# Patient Record
Sex: Female | Born: 1995 | Race: Black or African American | Hispanic: No | Marital: Single | Smoking: Never smoker
Health system: Southern US, Community
[De-identification: ages and names within clinical notes are randomized; demographics above are authoritative.]

---

## 2018-03-13 ENCOUNTER — Emergency Department (HOSPITAL_COMMUNITY)
Admission: EM | Admit: 2018-03-13 | Discharge: 2018-03-13 | Disposition: A | Discharge status: Home / Self Care | Payer: No Typology Code available for payment source | Attending: Emergency Medicine | Admitting: Emergency Medicine

## 2018-03-13 ENCOUNTER — Other Ambulatory Visit: Payer: Self-pay

## 2018-03-13 ENCOUNTER — Encounter (HOSPITAL_COMMUNITY): Payer: Self-pay | Admitting: Emergency Medicine

## 2018-03-13 ENCOUNTER — Emergency Department (HOSPITAL_COMMUNITY): Payer: No Typology Code available for payment source

## 2018-03-13 DIAGNOSIS — M545 Low back pain, unspecified: Secondary | ICD-10-CM

## 2018-03-13 DIAGNOSIS — Y939 Activity, unspecified: Secondary | ICD-10-CM | POA: Diagnosis not present

## 2018-03-13 DIAGNOSIS — Y92410 Unspecified street and highway as the place of occurrence of the external cause: Secondary | ICD-10-CM | POA: Insufficient documentation

## 2018-03-13 DIAGNOSIS — Y999 Unspecified external cause status: Secondary | ICD-10-CM | POA: Insufficient documentation

## 2018-03-13 DIAGNOSIS — S4992XA Unspecified injury of left shoulder and upper arm, initial encounter: Secondary | ICD-10-CM | POA: Diagnosis present

## 2018-03-13 DIAGNOSIS — S46819A Strain of other muscles, fascia and tendons at shoulder and upper arm level, unspecified arm, initial encounter: Secondary | ICD-10-CM | POA: Diagnosis not present

## 2018-03-13 LAB — POC URINE PREG, ED: PREG TEST UR: NEGATIVE

## 2018-03-13 MED ORDER — NAPROXEN 500 MG PO TABS: 500.0000 mg | ORAL_TABLET | Freq: Two times a day (BID) | ORAL | 0 refills | Status: DC

## 2018-03-13 MED ORDER — METHOCARBAMOL 500 MG PO TABS: 500.0000 mg | ORAL_TABLET | Freq: Two times a day (BID) | ORAL | 0 refills | Status: DC

## 2018-03-13 MED ORDER — KETOROLAC TROMETHAMINE 15 MG/ML IJ SOLN
15.0000 mg | Freq: Once | INTRAMUSCULAR | Status: AC
  Administered 2018-03-13: 15 mg via INTRAMUSCULAR
  Filled 2018-03-13: qty 1
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
## 2018-03-25 ENCOUNTER — Encounter (HOSPITAL_COMMUNITY): Payer: Self-pay | Admitting: Emergency Medicine

## 2018-03-25 ENCOUNTER — Ambulatory Visit (HOSPITAL_COMMUNITY)
Admission: EM | Admit: 2018-03-25 | Discharge: 2018-03-25 | Disposition: A | Discharge status: Home / Self Care | Payer: Self-pay | Attending: Family Medicine | Admitting: Family Medicine

## 2018-03-25 DIAGNOSIS — R030 Elevated blood-pressure reading, without diagnosis of hypertension: Secondary | ICD-10-CM

## 2018-03-25 DIAGNOSIS — R009 Unspecified abnormalities of heart beat: Secondary | ICD-10-CM

## 2018-03-25 DIAGNOSIS — F430 Acute stress reaction: Secondary | ICD-10-CM

## 2018-03-25 NOTE — Discharge Instructions (Addendum)
I believe the elevated heart rate is a normal reaction to stress.  When you rest, it goes back down to normal.  Heart exam is normal.  I do not believe you have any underlying heart condition.  I do not believe you have any underlying medical condition.  You are due for regular checkup  You are safe to be a plasma donor

## 2018-03-25 NOTE — ED Triage Notes (Signed)
Pt here for medical clearance for plasma donation; pt unsure if she has anxiety; pt denies complaint at present
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
## 2019-02-14 ENCOUNTER — Other Ambulatory Visit: Payer: Self-pay

## 2019-02-14 ENCOUNTER — Encounter (HOSPITAL_COMMUNITY): Payer: Self-pay | Admitting: Emergency Medicine

## 2019-02-14 ENCOUNTER — Emergency Department (HOSPITAL_COMMUNITY)
Admission: EM | Admit: 2019-02-14 | Discharge: 2019-02-14 | Disposition: A | Discharge status: Home / Self Care | Payer: Medicaid Other | Attending: Emergency Medicine | Admitting: Emergency Medicine

## 2019-02-14 DIAGNOSIS — R2242 Localized swelling, mass and lump, left lower limb: Secondary | ICD-10-CM | POA: Insufficient documentation

## 2019-02-14 DIAGNOSIS — M7989 Other specified soft tissue disorders: Secondary | ICD-10-CM

## 2019-02-14 NOTE — ED Triage Notes (Signed)
Pt c/o left foot swelling for apporx 3 weeks after starting a new job where has to stand on her feet for 8 hours and not wearing proper shoes. Pt also slipped at work and fell 2 weeks ago.  Also reports two dark spots on foot, unsure how long they have been there. Pt reports pain with weight bearing.

## 2019-02-14 NOTE — Discharge Instructions (Signed)
Elevate your leg as much as possible.  You can wear a compression stocking or an Ace wrap to help with compression and swelling.  You can take ibuprofen every 6 hours. Return to the emergency department if symptoms persist, worsen, or if you develop redness to your leg, shortness of breath or chest pain.

## 2019-02-14 NOTE — ED Provider Notes (Signed)
[facility] DEPT Provider Note   CSN: [id number] Arrival date & time: 02/14/19  1620     History   Chief Complaint Chief Complaint  Patient presents with  . Foot Swelling    left    HPI Sheryl Berger is a 23 y.o. female without significant past medical history, presenting to the emergency department with 3 weeks of left foot swelling.  She denies any particular injury.  She states she did start a new job where she is on her feet for 8 hours a day and thinks she may have worn too tight of shoes.  She reports pain in her foot with weightbearing.  Denies redness or obvious swelling to her calf.  She states her mother has history of blood clots though no personal history.  She is not currently on any exogenous estrogens.  She has not tried any interventions for her symptoms.  No shortness of breath or chest pain.     The history is provided by the patient.    History reviewed. No pertinent past medical history.  There are no active problems to display for this patient.   History reviewed. No pertinent surgical history.   OB History   No obstetric history on file.      Home Medications    Prior to Admission medications   Medication Sig Start Date End Date Taking? Authorizing Provider  methocarbamol (ROBAXIN) 500 MG tablet Take 1 tablet (500 mg total) by mouth 2 (two) times daily. 03/13/18   Henderly, Britni A, PA-C  naproxen (NAPROSYN) 500 MG tablet Take 1 tablet (500 mg total) by mouth 2 (two) times daily. 03/13/18   Henderly, Britni A, PA-C    Family History No family history on file.  Social History Social History   Tobacco Use  . Smoking status: Never Smoker  . Smokeless tobacco: Never Used  Substance Use Topics  . Alcohol use: Yes    Comment: occassional   . Drug use: Not on file     Allergies   Patient has no known allergies.   Review of Systems Review of Systems  All other systems reviewed and are negative.     Physical Exam Updated Vital Signs BP (!) 138/55 (BP Location: Left Arm)   Pulse 89   Temp 98.6 F (37 C) (Oral)   Resp 18   SpO2 99%   Physical Exam Vitals signs and nursing note reviewed.  Constitutional:      Appearance: She is well-developed.     Comments: Morbidly obese  HENT:     Head: Normocephalic and atraumatic.  Eyes:     Conjunctiva/sclera: Conjunctivae normal.  Cardiovascular:     Rate and Rhythm: Normal rate and regular rhythm.  Pulmonary:     Effort: Pulmonary effort is normal.  Abdominal:     Palpations: Abdomen is soft.  Musculoskeletal:     Comments: Left ankle and foot with swelling.  There is no tenderness, redness or warmth.  Intact distal pulses.  There is no calf tenderness, negative Homans' sign.  Normal range of motion.  Skin:    General: Skin is warm.  Neurological:     Mental Status: She is alert.  Psychiatric:        Behavior: Behavior normal.      ED Treatments / Results  Labs (all labs ordered are listed, but only abnormal results are displayed) Labs Reviewed - No data to display  EKG None  Radiology No results found.  Procedures Procedures (  including critical care time)  Medications Ordered in ED Medications - No data to display   Initial Impression / Assessment and Plan / ED Course  I have reviewed the triage vital signs and the nursing notes.  Pertinent labs & imaging results that were available during my care of the patient were reviewed by me and considered in my medical decision making (see chart for details).       Patient with 3 weeks of atraumatic left foot swelling.  No redness or warmth to suggest cellulitis.  No tenderness.  Patient reports mother with history of blood clots though no personal history and no other risk factors other than obesity.  Recommend further imaging including x-ray and venous ultrasound, however patient states she would prefer to hold off on any imaging at this time.  She states she would  like to attempt conservative management.  Discussed elevation, compression, ice, NSAIDs.  Discussed reasons to return to the emergency department including worsening symptoms or lack of improvement.  Patient is aware we are unable to rule out DVT without proper imaging.  She is safe for discharge at this time.  Discussed results, findings, treatment and follow up. Patient advised of return precautions. Patient verbalized understanding and agreed with plan.   Final Clinical Impressions(s) / ED Diagnoses   Final diagnoses:  Swelling of left foot    ED Discharge Orders    None       Daana Petrasek, [place] N, PA-C 02/14/19 1933    Terald Sleeper, MD 02/15/19 1150

## 2019-02-27 ENCOUNTER — Ambulatory Visit: Payer: Medicaid Other

## 2019-09-25 ENCOUNTER — Telehealth (INDEPENDENT_AMBULATORY_CARE_PROVIDER_SITE_OTHER): Payer: Medicaid Other | Admitting: Primary Care

## 2019-09-25 ENCOUNTER — Other Ambulatory Visit: Payer: Self-pay

## 2019-09-25 ENCOUNTER — Encounter (INDEPENDENT_AMBULATORY_CARE_PROVIDER_SITE_OTHER): Payer: Self-pay | Admitting: Primary Care

## 2019-09-25 DIAGNOSIS — R630 Anorexia: Secondary | ICD-10-CM

## 2019-09-25 DIAGNOSIS — Z7689 Persons encountering health services in other specified circumstances: Secondary | ICD-10-CM

## 2019-09-25 NOTE — Progress Notes (Addendum)
Virtual Visit via Telephone Note  I connected with Sheryl Berger on 09/25/19 at 10:30 AM EDT by telephone and verified that I am speaking with the correct person using two identifiers.   I discussed the limitations, risks, security and privacy concerns of performing an evaluation and management service by telephone and the availability of in person appointments. I also discussed with the patient that there may be a patient responsible charge related to this service. The patient expressed understanding and agreed to proceed. Patient location home Gwinda Passe, NP at [facility]  History of Present Illness: [name] is having a tele visit to establish care and follow up for physical and labs. She voices concerns with nausea at this time.  No past medical history on file.  No current outpatient medications on file prior to visit.   No current facility-administered medications on file prior to visit.   Observations/Objective: Review of Systems  Gastrointestinal: Positive for nausea.  All other systems reviewed and are negative.   Assessment and Plan: Sheryl Berger was seen today for new patient (initial visit).  Diagnoses and all orders for this visit:  Encounter to establish care Gwinda Passe, NP-C will be your  (PCP) she is mastered prepared . She is skilled to diagnosed and treat illness. Also able to answer health concern as well as continuing care of varied medical conditions, not limited by cause, organ system, or diagnosis.    Decrease in appetite When she does not eat she becomes nauseated . Encourage to eat on regular bases or have snack.   Follow Up Instructions:    I discussed the assessment and treatment plan with the patient. The patient was provided an opportunity to ask questions and all were answered. The patient agreed with the plan and demonstrated an understanding of the instructions.   The patient was advised to call back or seek an  in-person evaluation if the symptoms worsen or if the condition fails to improve as anticipated.  I provided 15 minutes of non-face-to-face time during this encounter. Includes previous visit , labs and imaging  Grayce Sessions, NP

## 2019-10-03 ENCOUNTER — Ambulatory Visit (INDEPENDENT_AMBULATORY_CARE_PROVIDER_SITE_OTHER): Payer: Medicaid Other | Admitting: Primary Care

## 2019-10-03 ENCOUNTER — Other Ambulatory Visit: Payer: Self-pay

## 2019-10-03 ENCOUNTER — Other Ambulatory Visit (HOSPITAL_COMMUNITY)
Admission: RE | Admit: 2019-10-03 | Discharge: 2019-10-03 | Disposition: A | Discharge status: Home / Self Care | Payer: Medicaid Other | Source: Ambulatory Visit | Attending: Primary Care | Admitting: Primary Care

## 2019-10-03 ENCOUNTER — Encounter (INDEPENDENT_AMBULATORY_CARE_PROVIDER_SITE_OTHER): Payer: Self-pay | Admitting: Primary Care

## 2019-10-03 VITALS — BP 111/78 | HR 66 | Temp 97.3°F | Ht 60.0 in | Wt 281.6 lb

## 2019-10-03 DIAGNOSIS — Z114 Encounter for screening for human immunodeficiency virus [HIV]: Secondary | ICD-10-CM

## 2019-10-03 DIAGNOSIS — Z124 Encounter for screening for malignant neoplasm of cervix: Secondary | ICD-10-CM

## 2019-10-03 DIAGNOSIS — Z01419 Encounter for gynecological examination (general) (routine) without abnormal findings: Secondary | ICD-10-CM | POA: Diagnosis present

## 2019-10-03 DIAGNOSIS — Z1159 Encounter for screening for other viral diseases: Secondary | ICD-10-CM

## 2019-10-03 DIAGNOSIS — Z23 Encounter for immunization: Secondary | ICD-10-CM

## 2019-10-03 NOTE — Progress Notes (Signed)
Subjective:     Sheryl Berger is a 24 y.o. female and is here for a comprehensive physical exam. The patient reports no problems.  Social History   Socioeconomic History  . Marital status: Single    Spouse name: Not on file  . Number of children: Not on file  . Years of education: Not on file  . Highest education level: Not on file  Occupational History  . Not on file  Tobacco Use  . Smoking status: Never Smoker  . Smokeless tobacco: Never Used  Vaping Use  . Vaping Use: Never used  Substance and Sexual Activity  . Alcohol use: Yes    Comment: occassional   . Drug use: Not on file  . Sexual activity: Not on file  Other Topics Concern  . Not on file  Social History Narrative  . Not on file   Social Determinants of Health   Financial Resource Strain:   . Difficulty of Paying Living Expenses:   Food Insecurity:   . Worried About Charity fundraiser in the Last Year:   . Arboriculturist in the Last Year:   Transportation Needs:   . Film/video editor (Medical):   Marland Kitchen Lack of Transportation (Non-Medical):   Physical Activity:   . Days of Exercise per Week:   . Minutes of Exercise per Session:   Stress:   . Feeling of Stress :   Social Connections:   . Frequency of Communication with Friends and Family:   . Frequency of Social Gatherings with Friends and Family:   . Attends Religious Services:   . Active Member of Clubs or Organizations:   . Attends Archivist Meetings:   Marland Kitchen Marital Status:   Intimate Partner Violence:   . Fear of Current or Ex-Partner:   . Emotionally Abused:   Marland Kitchen Physically Abused:   . Sexually Abused:    Health Maintenance  Topic Date Due  . Hepatitis C Screening  Never done  . COVID-19 Vaccine (1) Never done  . HIV Screening  Never done  . PAP-Cervical Cytology Screening  Never done  . PAP SMEAR-Modifier  Never done  . INFLUENZA VACCINE  11/23/2019  . TETANUS/TDAP  10/02/2029    Review of Systems A comprehensive review of  systems was negative.   Objective:  BP 111/78 (BP Location: Right Arm, Patient Position: Sitting, Cuff Size: Large)   Pulse 66   Temp (!) 97.3 F (36.3 C) (Temporal)   Ht 5' (1.524 m)   Wt 281 lb 9.6 oz (127.7 kg)   LMP 08/05/2019 (Approximate)   SpO2 98%   BMI 55.00 kg/m  Assessment:   CONSTITUTIONAL: Well-developed, well-nourished morbid obese female in no acute distress.  HENT:  Normocephalic, atraumatic, External right and left ear normal. Oropharynx is clear and moist EYES: Conjunctivae and EOM are normal. Pupils are equal, round, and reactive to light. No scleral icterus.  NECK: Normal range of motion, supple, no masses.  Normal thyroid.  SKIN: Skin is warm and dry. No rash noted. Not diaphoretic. No erythema. No pallor. [place]: Alert and oriented to person, place, and time. Normal reflexes, muscle tone coordination. No cranial nerve deficit noted. PSYCHIATRIC: Normal mood and affect. Normal behavior. Normal judgment and thought content. CARDIOVASCULAR: Normal heart rate noted, regular rhythm RESPIRATORY: Clear to auscultation bilaterally. Effort and breath sounds normal, no problems with respiration noted. BREASTS: Taught SBE ABDOMEN: Soft, normal bowel sounds, no distention noted.  No tenderness, rebound or guarding.  PELVIC: Normal appearing external genitalia; normal appearing vaginal mucosa and cervix.  abnormal discharge noted.  Pap smear obtained.  Normal uterine size, no other palpable masses, no uterine or adnexal tenderness. MUSCULOSKELETAL: Normal range of motion. No tenderness.  No cyanosis, clubbing, or edema.  2+ distal pulses.  Plan:  Sheryl Berger was seen today for gynecologic exam.  Diagnoses and all orders for this visit:  Need for Tdap vaccination Tdap is recommended every 10 years for adults weekly or primary she gets tetanus..  At least 1 of those doses should be with Tdap in adults age 42 and older who have previously received Tdap.  Recommend by the  CDC. -     Tdap vaccine greater than or equal to 7yo IM  Screening for malignant neoplasm of cervix -     Cytology - PAP([facility])  Encounter for gynecological examination without abnormal finding -     Cervicovaginal ancillary only  Screening for HIV (human immunodeficiency virus) -     HIV Antibody (routine testing w rflx)  Need for hepatitis C screening test -     Hepatitis C Antibody   See After Visit Summary for Counseling Recommendations

## 2019-10-03 NOTE — Patient Instructions (Signed)

## 2019-10-04 LAB — HEPATITIS C ANTIBODY: Hep C Virus Ab: <0.1 s/co ratio (ref 0.0–0.9)

## 2019-10-04 LAB — HIV ANTIBODY (ROUTINE TESTING W REFLEX): HIV Screen 4th Generation wRfx: NONREACTIVE

## 2019-10-06 LAB — CERVICOVAGINAL ANCILLARY ONLY
Bacterial Vaginitis (gardnerella): POSITIVE — AB
Candida Glabrata: NEGATIVE
Candida Vaginitis: NEGATIVE
Chlamydia: NEGATIVE
Comment: NEGATIVE
Comment: NEGATIVE
Comment: NEGATIVE
Comment: NEGATIVE
Comment: NEGATIVE
Comment: NORMAL
Neisseria Gonorrhea: NEGATIVE
Trichomonas: POSITIVE — AB

## 2019-10-07 LAB — CYTOLOGY - PAP
Chlamydia: NEGATIVE
Comment: NEGATIVE
Comment: NEGATIVE
Comment: NEGATIVE
Comment: NORMAL
Diagnosis: NEGATIVE
HSV1: NEGATIVE
HSV2: NEGATIVE
Neisseria Gonorrhea: NEGATIVE
Trichomonas: POSITIVE — AB

## 2019-10-09 ENCOUNTER — Encounter (INDEPENDENT_AMBULATORY_CARE_PROVIDER_SITE_OTHER): Payer: Self-pay | Admitting: Primary Care

## 2019-10-09 ENCOUNTER — Ambulatory Visit (INDEPENDENT_AMBULATORY_CARE_PROVIDER_SITE_OTHER): Payer: Self-pay | Admitting: Primary Care

## 2019-10-09 ENCOUNTER — Other Ambulatory Visit: Payer: Self-pay

## 2019-10-09 VITALS — BP 114/81 | HR 98 | Temp 98.7°F | Ht 60.0 in | Wt 286.6 lb

## 2019-10-09 DIAGNOSIS — A599 Trichomoniasis, unspecified: Secondary | ICD-10-CM

## 2019-10-09 DIAGNOSIS — N760 Acute vaginitis: Secondary | ICD-10-CM

## 2019-10-09 DIAGNOSIS — B9689 Other specified bacterial agents as the cause of diseases classified elsewhere: Secondary | ICD-10-CM

## 2019-10-09 MED ORDER — METRONIDAZOLE 500 MG PO TABS
2000.0000 mg | ORAL_TABLET | Freq: Once | ORAL | Status: AC
  Administered 2019-10-09: 2000 mg via ORAL

## 2019-10-09 NOTE — Patient Instructions (Signed)

## 2019-10-09 NOTE — Progress Notes (Signed)
 Acute Office Visit  Subjective:    Patient ID: Sheryl Berger, female    DOB: 09/09/1995, 24 y.o.   MRN: [id number]  Chief Complaint  Patient presents with  . Exposure to STD    treatment     HPI [name] is a 24 year old female in today for an acute visit treatment for  Trichomoniasis and bacteria.  No past medical history on file.  No past surgical history on file.  No family history on file.  Social History   Socioeconomic History  . Marital status: Single    Spouse name: Not on file  . Number of children: Not on file  . Years of education: Not on file  . Highest education level: Not on file  Occupational History  . Not on file  Tobacco Use  . Smoking status: Never Smoker  . Smokeless tobacco: Never Used  Vaping Use  . Vaping Use: Never used  Substance and Sexual Activity  . Alcohol use: Yes    Comment: occassional   . Drug use: Not on file  . Sexual activity: Not on file  Other Topics Concern  . Not on file  Social History Narrative  . Not on file   Social Determinants of Health   Financial Resource Strain:   . Difficulty of Paying Living Expenses:   Food Insecurity:   . Worried About Running Out of Food in the Last Year:   . Ran Out of Food in the Last Year:   Transportation Needs:   . Lack of Transportation (Medical):   . Lack of Transportation (Non-Medical):   Physical Activity:   . Days of Exercise per Week:   . Minutes of Exercise per Session:   Stress:   . Feeling of Stress :   Social Connections:   . Frequency of Communication with Friends and Family:   . Frequency of Social Gatherings with Friends and Family:   . Attends Religious Services:   . Active Member of Clubs or Organizations:   . Attends Club or Organization Meetings:   . Marital Status:   Intimate Partner Violence:   . Fear of Current or Ex-Partner:   . Emotionally Abused:   . Physically Abused:   . Sexually Abused:     No outpatient medications prior to visit.    No facility-administered medications prior to visit.    No Known Allergies  Review of Systems  Genitourinary: Positive for vaginal discharge.       Odor   All other systems reviewed and are negative.      Objective:    Physical Exam Vitals reviewed.  Constitutional:      Appearance: She is obese.     Comments: Morbid   Cardiovascular:     Rate and Rhythm: Normal rate and regular rhythm.  Pulmonary:     Effort: Pulmonary effort is normal.     Breath sounds: Normal breath sounds.  Abdominal:     General: Bowel sounds are normal.  Musculoskeletal:        General: Normal range of motion.     Cervical back: Normal range of motion.  Skin:    General: Skin is warm and dry.  Neurological:     Mental Status: She is alert and oriented to person, place, and time.  Psychiatric:        Mood and Affect: Mood normal.        Behavior: Behavior normal.        Thought Content:   Thought content normal.        Judgment: Judgment normal.     BP 114/81 (BP Location: Left Arm, Patient Position: Sitting, Cuff Size: Large)   Pulse 98   Temp 98.7 F (37.1 C) (Oral)   Ht 5' (1.524 m)   Wt 286 lb 9.6 oz (130 kg)   SpO2 100%   BMI 55.97 kg/m  Wt Readings from Last 3 Encounters:  10/09/19 286 lb 9.6 oz (130 kg)  10/03/19 281 lb 9.6 oz (127.7 kg)    Health Maintenance Due  Topic Date Due  . COVID-19 Vaccine (1) Never done    There are no preventive care reminders to display for this patient.   No results found for: TSH No results found for: WBC, HGB, HCT, MCV, PLT No results found for: NA, K, CHLORIDE, CO2, GLUCOSE, BUN, CREATININE, BILITOT, ALKPHOS, AST, ALT, PROT, ALBUMIN, CALCIUM, ANIONGAP, EGFR, GFR No results found for: CHOL No results found for: HDL No results found for: LDLCALC No results found for: TRIG No results found for: CHOLHDL No results found for: HGBA1C     Assessment & Plan:  Sheryl Berger was seen today for exposure to std.  Diagnoses and all orders for  this visit:  Trichomoniasis Pt counseled regarding condom use with each sexual activity to promote wellness and prevention of transmission of HIV, syphilis, herpes simplex virus, gonorrhea, chlamydia and trichomoniasis..  Reliable web sites such as cdc.std -     metroNIDAZOLE (FLAGYL) tablet 2,000 mg  Bacterial vaginosis metroNIDAZOLE (FLAGYL) tablet 2,000 mg    Meds ordered this encounter  Medications  . metroNIDAZOLE (FLAGYL) tablet 2,000 mg     Kerin Perna, NP

## 2019-11-05 ENCOUNTER — Emergency Department (HOSPITAL_COMMUNITY)
Admission: EM | Admit: 2019-11-05 | Discharge: 2019-11-05 | Disposition: A | Discharge status: Home / Self Care | Payer: Self-pay | Attending: Emergency Medicine | Admitting: Emergency Medicine

## 2019-11-05 ENCOUNTER — Emergency Department (HOSPITAL_COMMUNITY): Payer: Self-pay

## 2019-11-05 ENCOUNTER — Encounter (HOSPITAL_COMMUNITY): Payer: Self-pay | Admitting: *Deleted

## 2019-11-05 ENCOUNTER — Other Ambulatory Visit: Payer: Self-pay

## 2019-11-05 ENCOUNTER — Emergency Department (HOSPITAL_BASED_OUTPATIENT_CLINIC_OR_DEPARTMENT_OTHER): Payer: Self-pay

## 2019-11-05 DIAGNOSIS — R2242 Localized swelling, mass and lump, left lower limb: Secondary | ICD-10-CM | POA: Insufficient documentation

## 2019-11-05 DIAGNOSIS — M7989 Other specified soft tissue disorders: Secondary | ICD-10-CM

## 2019-11-05 DIAGNOSIS — R609 Edema, unspecified: Secondary | ICD-10-CM

## 2019-11-05 MED ORDER — NAPROXEN 500 MG PO TABS: 500.0000 mg | ORAL_TABLET | Freq: Two times a day (BID) | ORAL | 0 refills | Status: AC

## 2019-11-05 NOTE — ED Triage Notes (Signed)
Pt complains of left ankle swelling for the past couple of weeks. Swelling began when she started working and is worse at night. Pt stands throughout the day at work.

## 2019-11-05 NOTE — ED Notes (Signed)
Pt aware we need her to change into a gown.  Gown at bedside.  Pt will change after xray.

## 2019-11-05 NOTE — ED Provider Notes (Signed)
[facility]-EMERGENCY DEPT Provider Note   CSN: [id number] Arrival date & time: 11/05/19  1340     History Chief Complaint  Patient presents with  . Leg Swelling    left    Sheryl Berger is a 24 y.o. female with no known past medical history.  HPI Patient presents to emergency department today with chief complaint of intermittent left ankle swelling x2 weeks.  She states she has pain when bearing weight on her left lower extremity.  She is able to walk without difficulty.  She is describing the pain as an aching sensation.  Pain is located in her ankle and does not radiate.  She rates the pain 8 of 10 in severity.  She denies treated to her ankle.  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  She does wear tight shoes to work, on sure if this is causing her pain. She denies history of blood clots, not on exogenous estrogen. She admits her mother and aunt have history of blood clots and are on blood thinners. She also denies fever, chills, numbness, weakness, shortness of breath, chest pain,  Patient states she had similar symptoms x6 months ago was evaluated in the emergency department.  At that time she also started a job and was in her feet for long periods of time.  She ultimately quit the job and pain and swelling resolved.      History reviewed. No pertinent past medical history.  There are no problems to display for this patient.   History reviewed. No pertinent surgical history.   OB History   No obstetric history on file.     No family history on file.  Social History   Tobacco Use  . Smoking status: Never Smoker  . Smokeless tobacco: Never Used  Vaping Use  . Vaping Use: Never used  Substance Use Topics  . Alcohol use: Yes    Comment: occassional   . Drug use: Not on file    Home Medications Prior to Admission medications   Medication Sig Start Date End  Date Taking? Authorizing Provider  naproxen (NAPROSYN) 500 MG tablet Take 1 tablet (500 mg total) by mouth 2 (two) times daily for 7 days. 11/05/19 11/12/19  Alexcis Bicking, Caroleen HammanKaitlyn E, PA-C    Allergies    Patient has no known allergies.  Review of Systems   Review of Systems All other systems are reviewed and are negative for acute change except as noted in the HPI.  Physical Exam Updated Vital Signs BP (!) 149/106 (BP Location: Left Arm)   Pulse 89   Temp 98.1 F (36.7 C) (Oral)   Resp 18   LMP 11/03/2019   SpO2 95%   Physical Exam Vitals and nursing note reviewed.  Constitutional:      Appearance: She is well-developed. She is obese. She is not ill-appearing or toxic-appearing.  HENT:     Head: Normocephalic and atraumatic.     Nose: Nose normal.  Eyes:     General: No scleral icterus.       Right eye: No discharge.        Left eye: No discharge.     Conjunctiva/sclera: Conjunctivae normal.  Neck:     Vascular: No JVD.  Cardiovascular:     Rate and Rhythm: Normal rate and regular rhythm.     Pulses: Normal pulses.     Heart sounds: Normal heart  sounds.  Pulmonary:     Effort: Pulmonary effort is normal.     Breath sounds: Normal breath sounds.  Abdominal:     General: There is no distension.  Musculoskeletal:        General: Normal range of motion.     Cervical back: Normal range of motion.     Comments: Compartments are soft in left lower extremity.  Left ankle and foot with mild swelling.  There is no tenderness, redness, warmth or overlying skin changes.  DP pulses 2+ bilaterally.  Negative Homans signs.  Normal range of motion of left hip, knee and ankle.  No break in skin.  Able to wiggle toes that difficulty.  Prescribe refill.  Skin:    General: Skin is warm and dry.  Neurological:     Mental Status: She is oriented to person, place, and time.     GCS: GCS eye subscore is 4. GCS verbal subscore is 5. GCS motor subscore is 6.     Comments: Fluent speech, no  facial droop.  Psychiatric:        Behavior: Behavior normal.     ED Results / Procedures / Treatments   Labs (all labs ordered are listed, but only abnormal results are displayed) Labs Reviewed - No data to display  EKG None  Radiology DG Ankle Complete Left  Result Date: 11/05/2019 CLINICAL DATA:  Left foot and ankle pain and swelling EXAM: LEFT ANKLE COMPLETE - 3+ VIEW; LEFT FOOT - COMPLETE 3+ VIEW COMPARISON:  None. FINDINGS: Left ankle: Frontal, oblique, and lateral views demonstrate no fracture, subluxation, or dislocation. The ankle mortise is intact. Soft tissues are normal. Left foot: Frontal, oblique, and lateral views of the left foot are obtained. No fracture, subluxation, or dislocation. Joint spaces are well preserved. IMPRESSION: 1. Unremarkable left foot and ankle. Electronically Signed   By: Sharlet Salina M.D.   On: 11/05/2019 15:07   DG Foot Complete Left  Result Date: 11/05/2019 CLINICAL DATA:  Left foot and ankle pain and swelling EXAM: LEFT ANKLE COMPLETE - 3+ VIEW; LEFT FOOT - COMPLETE 3+ VIEW COMPARISON:  None. FINDINGS: Left ankle: Frontal, oblique, and lateral views demonstrate no fracture, subluxation, or dislocation. The ankle mortise is intact. Soft tissues are normal. Left foot: Frontal, oblique, and lateral views of the left foot are obtained. No fracture, subluxation, or dislocation. Joint spaces are well preserved. IMPRESSION: 1. Unremarkable left foot and ankle. Electronically Signed   By: Sharlet Salina M.D.   On: 11/05/2019 15:07   VAS [place] LOWER EXTREMITY VENOUS (DVT) (ONLY [facility] & [facility])  Result Date: 11/05/2019  Lower Venous DVTStudy Indications: Edema.  Risk Factors: Family history of DVT. Limitations: Body habitus and poor ultrasound/tissue interface. Comparison Study: No prior studies. Performing Technologist: Jean Rosenthal  Examination Guidelines: A complete evaluation includes B-mode imaging, spectral Doppler, color Doppler, and power Doppler as needed of  all accessible portions of each vessel. Bilateral testing is considered an integral part of a complete examination. Limited examinations for reoccurring indications may be performed as noted. The reflux portion of the exam is performed with the patient in reverse Trendelenburg.  +-----+---------------+---------+-----------+----------+--------------+ RIGHTCompressibilityPhasicitySpontaneityPropertiesThrombus Aging +-----+---------------+---------+-----------+----------+--------------+ CFV  Full           Yes      Yes                                 +-----+---------------+---------+-----------+----------+--------------+   +---------+---------------+---------+-----------+----------+-------------------+ LEFT  CompressibilityPhasicitySpontaneityPropertiesThrombus Aging      +---------+---------------+---------+-----------+----------+-------------------+ CFV      Full                                                             +---------+---------------+---------+-----------+----------+-------------------+ SFJ      Full                                                             +---------+---------------+---------+-----------+----------+-------------------+ FV Prox  Full                                                             +---------+---------------+---------+-----------+----------+-------------------+ FV Mid   Full                                         Limited visibility,                                                       visualized segments                                                       patent              +---------+---------------+---------+-----------+----------+-------------------+ FV DistalFull                                         Limited visibility,                                                       visualized segments                                                       patent               +---------+---------------+---------+-----------+----------+-------------------+ PFV      Full                                                             +---------+---------------+---------+-----------+----------+-------------------+  POP      Full           Yes      Yes                                      +---------+---------------+---------+-----------+----------+-------------------+ PTV      Full                                                             +---------+---------------+---------+-----------+----------+-------------------+ PERO                                                  Not visualized      +---------+---------------+---------+-----------+----------+-------------------+     Summary: RIGHT: - No evidence of common femoral vein obstruction.  LEFT: - There is no evidence of deep vein thrombosis in the lower extremity. However, portions of this examination were limited- see technologist comments above.  - No cystic structure found in the popliteal fossa.  *See table(s) above for measurements and observations.    Preliminary     Procedures Procedures (including critical care time)  Medications Ordered in ED Medications - No data to display  ED Course  I have reviewed the triage vital signs and the nursing notes.  Pertinent labs & imaging results that were available during my care of the patient were reviewed by me and considered in my medical decision making (see chart for details).    MDM Rules/Calculators/A&P                          History provided by patient with additional history obtained from chart review.    Patient is well appearing, in no acute distress. She has minimal swelling localized to left ankle.  Compartments of left lower extremity are soft.  She has full range of motion of left ankle.  She ambulates with normal gait.  There are no signs of a septic joint.  No overlying signs of infection.   Discussed with patient that DVT is  unlikely given that she had similar symptoms x6 months ago that resolved after she quit her and was not standing on her feet for long periods of time. She is still concerned for DVT as her mother and sister had history of the same.  Xray of left foot and ankle viewed by me are negative any fracture or dislocation.Marland Kitchen DVT study is negative. I had lengthy discussion with patient about compression stocking and 7 anti-inflammatory.  I recommend patient have pregnancy test prior to taking anti-inflammatories however she declines need for test and refuses.  BP was elevated in triage however when rechecked was normal at discharge.  The patient appears reasonably screened and/or stabilized for discharge and I doubt any other medical condition or other [facility] requiring further screening, evaluation, or treatment in the ED at this time prior to discharge. The patient is safe for discharge with strict return precautions discussed. Recommend pcp follow up if symptoms persist.   Portions of this note were generated with Dragon dictation software. Dictation errors may  occur despite best attempts at proofreading.   Final Clinical Impression(s) / ED Diagnoses Final diagnoses:  Swelling of left foot    Rx / DC Orders ED Discharge Orders         Ordered    naproxen (NAPROSYN) 500 MG tablet  2 times daily     Discontinue  Reprint     11/05/19 1631           Sherene Sires, PA-C 11/05/19 1648    Bethann Berkshire, MD 11/06/19 [phone]

## 2019-11-05 NOTE — Discharge Instructions (Addendum)
You have been seen today for leg pain and swelling. Please read and follow all provided instructions. Return to the emergency room for worsening condition or new concerning symptoms.    I have included some information about claudication. This could be the cause of your symptoms also, however I am not diagnosing you with this right now. I wanted to give you more information incase it could help  1. Medications:  Prescription sent to your pharmacy for Naproxen. This is an antiinflammatory and should help with swelling. Take with food so it does not cause upset stomach.  Do not  take any additional Aleve, Motrin, ibuprofen as medications are all similar. -You can also take tylenol for pain   Continue usual home medications Take medications as prescribed. Please review all of the medicines and only take them if you do not have an allergy to them.   2. Treatment: rest and elevate. Wear a compression stocking to help with pain and swelling as we discussed.  3. Follow Up:  Please follow up with primary care provider by scheduling an appointment as soon as possible for a visit   If you do not have a primary care physician, contact [facility] at [phone] for referral  The [facility] is actually in [place]. Follow up if needed.   It is also a possibility that you have an allergic reaction to any of the medicines that you have been prescribed - Everybody reacts differently to medications and while MOST people have no trouble with most medicines, you may have a reaction such as nausea, vomiting, rash, swelling, shortness of breath. If this is the case, please stop taking the medicine immediately and contact your physician.  ?

## 2019-11-05 NOTE — Progress Notes (Signed)
Lower extremity venous LT study completed.   Preliminary results discussed with PA.  See Cv Proc for preliminary results.   Sheryl Berger

## 2019-11-05 NOTE — ED Notes (Signed)
Ultrasound at bedside

## 2020-07-05 NOTE — Addendum Note (Signed)
Addended by: Grayce Sessions on: 07/05/2020 10:29 PM   Modules accepted: Level of Service

## 2020-12-29 IMAGING — CR DG ANKLE COMPLETE 3+V*L*
3 series · 3 of 3 positions shown · non-contrast
Comparison: None.

CLINICAL DATA: Left foot and ankle pain and swelling

EXAM:
LEFT ANKLE COMPLETE - 3+ VIEW; LEFT FOOT - COMPLETE 3+ VIEW

[x ankle lat left]
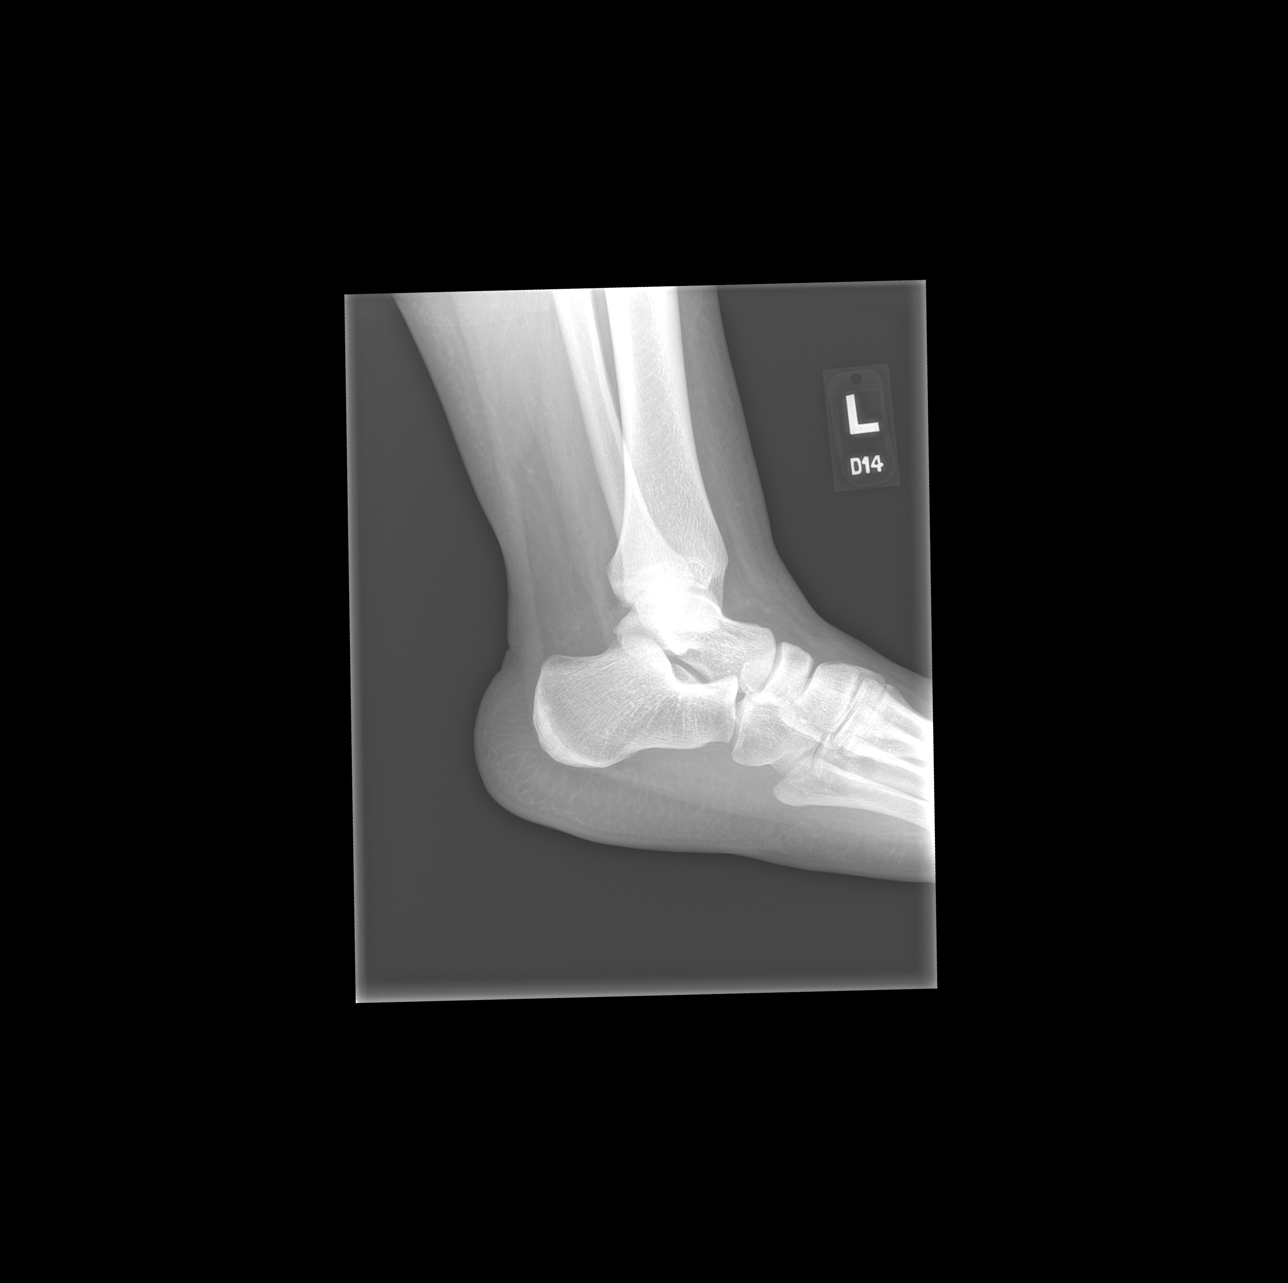

[x ankle ap left]
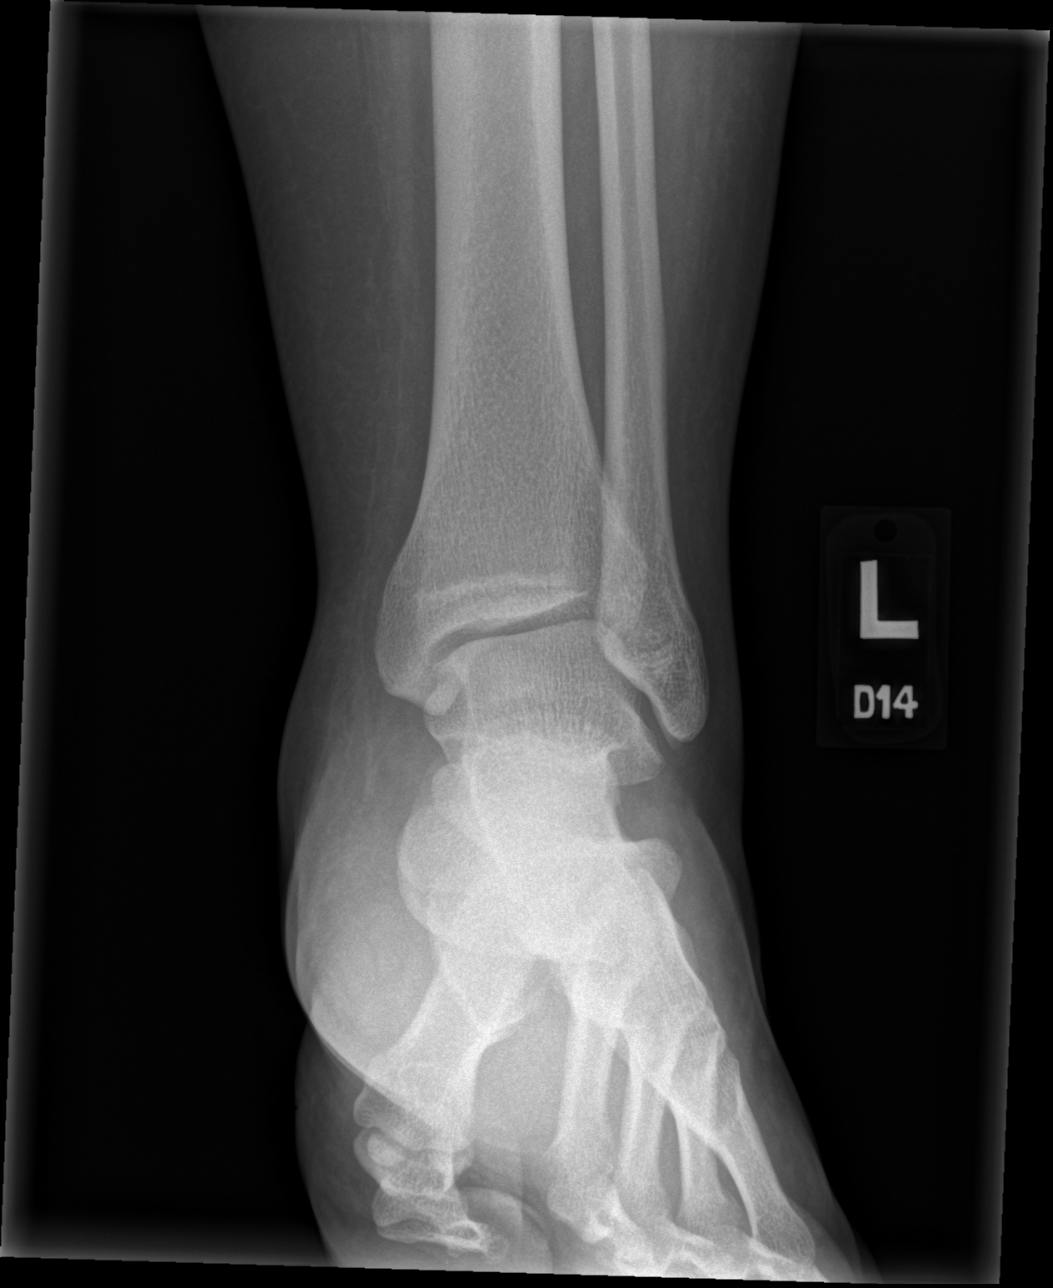

[x ankle obl left]
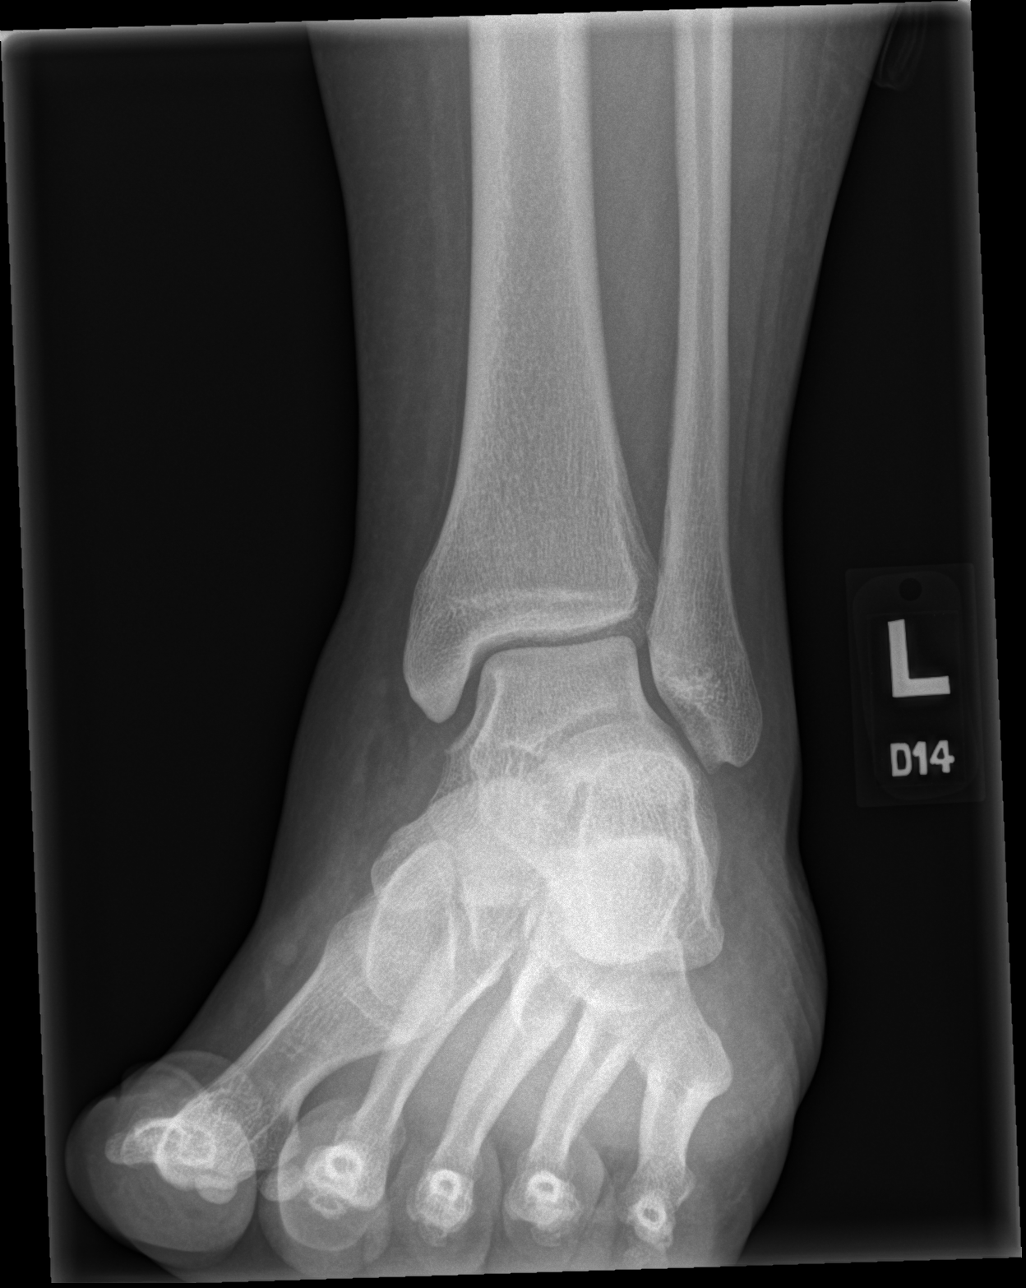

[3 of 3 positions shown; findings below may reference images not displayed]

FINDINGS: Left ankle: Frontal, oblique, and lateral views demonstrate no
fracture, subluxation, or dislocation. The ankle mortise is intact.
Soft tissues are normal.

Left foot: Frontal, oblique, and lateral views of the left foot are
obtained. No fracture, subluxation, or dislocation. Joint spaces are
well preserved.
IMPRESSION: 1. Unremarkable left foot and ankle.
